# Patient Record
Sex: Female | Born: 1955 | Race: Black or African American | Hispanic: No | Marital: Single | Smoking: Never smoker
Health system: Southern US, Community
[De-identification: ages and names within clinical notes are randomized; demographics above are authoritative.]

## PROBLEM LIST (undated history)

## (undated) DIAGNOSIS — I10 Essential (primary) hypertension: Secondary | ICD-10-CM

## (undated) DIAGNOSIS — E785 Hyperlipidemia, unspecified: Secondary | ICD-10-CM

## (undated) DIAGNOSIS — K219 Gastro-esophageal reflux disease without esophagitis: Secondary | ICD-10-CM

## (undated) HISTORY — DX: Gastro-esophageal reflux disease without esophagitis: K21.9

## (undated) HISTORY — DX: Essential (primary) hypertension: I10

## (undated) HISTORY — DX: Hyperlipidemia, unspecified: E78.5

---

## 2020-04-09 ENCOUNTER — Other Ambulatory Visit: Payer: Self-pay | Admitting: Family Medicine

## 2020-04-09 DIAGNOSIS — R42 Dizziness and giddiness: Secondary | ICD-10-CM

## 2020-04-09 DIAGNOSIS — R2681 Unsteadiness on feet: Secondary | ICD-10-CM

## 2020-04-29 ENCOUNTER — Ambulatory Visit
Admission: RE | Admit: 2020-04-29 | Discharge: 2020-04-29 | Disposition: A | Discharge status: Home / Self Care | Payer: 59 | Source: Ambulatory Visit | Attending: Family Medicine | Admitting: Family Medicine

## 2020-04-29 DIAGNOSIS — R2681 Unsteadiness on feet: Secondary | ICD-10-CM

## 2020-04-29 DIAGNOSIS — R42 Dizziness and giddiness: Secondary | ICD-10-CM

## 2020-05-14 ENCOUNTER — Other Ambulatory Visit: Payer: Self-pay | Admitting: Family Medicine

## 2020-05-14 DIAGNOSIS — Z1231 Encounter for screening mammogram for malignant neoplasm of breast: Secondary | ICD-10-CM

## 2020-08-16 ENCOUNTER — Ambulatory Visit: Payer: 59

## 2020-08-28 ENCOUNTER — Ambulatory Visit: Payer: 59

## 2020-10-04 ENCOUNTER — Ambulatory Visit
Admission: RE | Admit: 2020-10-04 | Discharge: 2020-10-04 | Disposition: A | Discharge status: Home / Self Care | Payer: 59 | Source: Ambulatory Visit | Attending: Family Medicine | Admitting: Family Medicine

## 2020-10-04 ENCOUNTER — Other Ambulatory Visit: Payer: Self-pay

## 2020-10-04 DIAGNOSIS — Z1231 Encounter for screening mammogram for malignant neoplasm of breast: Secondary | ICD-10-CM

## 2020-10-09 DIAGNOSIS — R053 Chronic cough: Secondary | ICD-10-CM | POA: Insufficient documentation

## 2020-10-09 DIAGNOSIS — F320 Major depressive disorder, single episode, mild: Secondary | ICD-10-CM | POA: Insufficient documentation

## 2020-10-09 DIAGNOSIS — I10 Essential (primary) hypertension: Secondary | ICD-10-CM | POA: Insufficient documentation

## 2020-10-09 DIAGNOSIS — F32A Depression, unspecified: Secondary | ICD-10-CM | POA: Insufficient documentation

## 2020-10-09 DIAGNOSIS — F411 Generalized anxiety disorder: Secondary | ICD-10-CM | POA: Insufficient documentation

## 2020-10-10 ENCOUNTER — Telehealth: Payer: Self-pay | Admitting: Pulmonary Disease

## 2020-10-10 ENCOUNTER — Ambulatory Visit (INDEPENDENT_AMBULATORY_CARE_PROVIDER_SITE_OTHER): Payer: 59 | Admitting: Pulmonary Disease

## 2020-10-10 ENCOUNTER — Encounter: Payer: Self-pay | Admitting: Pulmonary Disease

## 2020-10-10 ENCOUNTER — Other Ambulatory Visit: Payer: Self-pay

## 2020-10-10 VITALS — BP 122/84 | HR 95 | Temp 97.8°F | Ht 62.0 in | Wt 122.4 lb

## 2020-10-10 DIAGNOSIS — F411 Generalized anxiety disorder: Secondary | ICD-10-CM

## 2020-10-10 DIAGNOSIS — J45991 Cough variant asthma: Secondary | ICD-10-CM

## 2020-10-10 DIAGNOSIS — I10 Essential (primary) hypertension: Secondary | ICD-10-CM

## 2020-10-10 DIAGNOSIS — R053 Chronic cough: Secondary | ICD-10-CM | POA: Diagnosis not present

## 2020-10-10 DIAGNOSIS — J3089 Other allergic rhinitis: Secondary | ICD-10-CM

## 2020-10-10 DIAGNOSIS — F320 Major depressive disorder, single episode, mild: Secondary | ICD-10-CM

## 2020-10-10 MED ORDER — BREO ELLIPTA 200-25 MCG/INH IN AEPB: 1.0000 | INHALATION_SPRAY | Freq: Every day | RESPIRATORY_TRACT | 2 refills | Status: DC

## 2020-10-10 MED ORDER — MONTELUKAST SODIUM 10 MG PO TABS: 10.0000 mg | ORAL_TABLET | Freq: Every day | ORAL | 2 refills | Status: DC

## 2020-10-10 NOTE — Telephone Encounter (Signed)
Called and spoke with patient regarding questions about her medication. Patient states she was prescribed Singulair and Breo inhaler today. She questions if she should continue the zyrtec and flonase.   Dr. Judeth Horn please advise

## 2020-10-10 NOTE — Progress Notes (Signed)
@Patient  ID: , female    DOB: [birth year], 65 y.o.   MRN: [id number]  Chief Complaint  Patient presents with  . Consult    Sometimes productive cough, clear phlegm. Other times non productive, since 2012.     Referring provider: 2013, MD  HPI:   65 year old whom we are seeing in consultation for chronic cough.  PCP note reviewed.  Patient notes cough for most majority of her life at this point time.  She is had ongoing issues with reflux.  Has had endoscopies many years ago.  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  This was stopped due to ongoing reflux, irritation.  She takes PPI.  She does have breakthrough heartburn.  She reports frequent reflux symptoms despite this.  In addition, she does endorse occasional dysphagia primarily with solids.  Has a drink a few swallows of water and things improve her move on.    Her cough is largely dry, occasionally productive.  Seems worse often when eating.  Worse when she lies down at night.  No other timing during the day where things are better or worse.  She does have seasonal allergies but is unsure if the cough worsens with seasonal changes and these allergy symptoms.  No new environment to account for symptoms.  No other alleviating or exacerbating factors.  She reports had recent chest x-ray via her PCP office.  I cannot view these images.  She was apparently told that reassuring, normal.  PMH: Hypertension, seasonal allergies, GERD Surgical history: Hysterectomy 1995 Family history: Her mother asthma, mother with coronary disease, father with coronary disease, mother with breast cancer, father with testicular cancer Social history: Never smoker, lives in [place] / Pulmonary Flowsheets:   ACT:  No flowsheet data found.  MMRC: No flowsheet data found.  Epworth:  No flowsheet data found.  Tests:   FENO:  No results found for: NITRICOXIDE  PFT: No  flowsheet data found.  WALK:  No flowsheet data found.  Imaging: MM 3D SCREEN BREAST BILATERAL  Result Date: 10/09/2020 CLINICAL DATA:  Screening. EXAM: DIGITAL SCREENING BILATERAL MAMMOGRAM WITH TOMOSYNTHESIS AND CAD TECHNIQUE: Bilateral screening digital craniocaudal and mediolateral oblique mammograms were obtained. Bilateral screening digital breast tomosynthesis was performed. The images were evaluated with computer-aided detection. COMPARISON:  Previous exam(s). ACR Breast Density Category c: The breast tissue is heterogeneously dense, which may obscure small masses. FINDINGS: There are no findings suspicious for malignancy. The images were evaluated with computer-aided detection. IMPRESSION: No mammographic evidence of malignancy. A result letter of this screening mammogram will be mailed directly to the patient. RECOMMENDATION: Screening mammogram in one year. (Code:SM-B-01Y) BI-RADS CATEGORY  1: Negative. Electronically Signed   By: 10/11/2020 M.D.   On: 10/09/2020 15:31    Lab Results:  CBC No results found for: WBC, RBC, HGB, HCT, PLT, MCV, MCH, MCHC, RDW, LYMPHSABS, MONOABS, EOSABS, BASOSABS  BMET No results found for: NA, K, CL, CO2, GLUCOSE, BUN, CREATININE, CALCIUM, GFRNONAA, GFRAA  BNP No results found for: BNP  ProBNP No results found for: PROBNP  Specialty Problems      Pulmonary Problems   Chronic cough      Allergies  Allergen Reactions  . Timoptic [Timolol] Cough    Asthma like symptoms     There is no immunization history on file for this patient.  Past Medical History:  Diagnosis Date  . Gastric reflux   . Hyperlipidemia   .  Hypertension     Tobacco History: Social History   Tobacco Use  Smoking Status Never Smoker  Smokeless Tobacco Never Used   Counseling given: Not Answered   Continue to not smoke  Outpatient Encounter Medications as of 10/10/2020  Medication Sig  . amLODipine (NORVASC) 10 MG tablet Take 1 tablet by mouth daily.   . cetirizine HCl (ZYRTEC) 1 MG/ML solution Take 1 mg by mouth daily.  . citalopram (CELEXA) 20 MG tablet Take 1 tablet by mouth daily.  . fluticasone (FLONASE) 50 MCG/ACT nasal spray Place 1 spray into both nostrils daily.  . fluticasone furoate-vilanterol (BREO ELLIPTA) 200-25 MCG/INH AEPB Inhale 1 puff into the lungs daily.  Marland Kitchen latanoprost (XALATAN) 0.005 % ophthalmic solution Place 1 drop into both eyes at bedtime.  . montelukast (SINGULAIR) 10 MG tablet Take 1 tablet (10 mg total) by mouth at bedtime.  Marland Kitchen omeprazole (PRILOSEC) 40 MG capsule Take 1 capsule by mouth daily.  . [DISCONTINUED] amLODipine (NORVASC) 10 MG tablet Take 1 tablet by mouth daily.   No facility-administered encounter medications on file as of 10/10/2020.     Review of Systems  Review of Systems  No chest pain with exertion.  No orthopnea or PND.  Comprehensive review of systems otherwise negative.  Physical Exam  BP 122/84 (BP Location: Left Arm, Cuff Size: Normal)   Pulse 95   Temp 97.8 F (36.6 C) (Temporal)   Ht 5\' 2"  (1.575 m)   Wt 122 lb 6.4 oz (55.5 kg)   SpO2 97%   BMI 22.39 kg/m   Wt Readings from Last 5 Encounters:  10/10/20 122 lb 6.4 oz (55.5 kg)    BMI Readings from Last 5 Encounters:  10/10/20 22.39 kg/m     Physical Exam General: Well-appearing, no acute distress Eyes: EOMI, no icterus Neck: Supple: No JVP Pulmonary: Clear to auscultation bilaterally, no wheeze, frequent barky cough suspicious for laryngeal edema, no stridor Cardiovascular: Regular rate and rhythm, no murmurs Abdomen: Nondistended, bowel sounds present MSK: No synovitis, no joint effusions Neuro: Normal gait, no weakness Psych: Normal mood, full affect  Assessment & Plan:   Chronic Cough: Present for majority of her life at this point.  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  Has had ongoing cough since then, 10 years prior. Most likely component of GERD +/- asthma.    Dysphagia: With reflux despite PPI. --Barium swallow, likely will need GI referral regardless of result  Asthma: atopic symptoms cough. Worsened by uncontrolled reflux. W/u as above. --Breo prescribed today  Return in about 6 weeks (around 11/21/2020).   01/21/2021, MD 10/10/2020

## 2020-10-10 NOTE — Telephone Encounter (Signed)
Call returned to patient, confirmed DOB. Made aware to continue flonase and zyrtec. Voiced understanding.   Nothing further needed at this time.

## 2020-10-10 NOTE — Patient Instructions (Addendum)
Nice to meet you  I think there are many reasons you are coughing  I have ordered a barium swallow to evaluate for reflux or things getting stuck when you swallow.  Someone will call and schedule this.  For the nasal congestion, take montelukast 1 pill nightly.  This may help with the cough if congestion is causing it.  With the congestion and allergies, I worry about something like asthma.  The montelukast may help with this.  In addition use Breo 1 puff daily.  Rinse your mouth out with water after every use.  If this is too expensive, please call us and we will try to find a better solution.  Return to clinic in 6 weeks for follow-up with Dr. Judeth Horn.

## 2020-10-10 NOTE — Telephone Encounter (Signed)
LMTCB

## 2020-10-10 NOTE — Telephone Encounter (Signed)
Yes, please continue Flonase and Zyrtec. I had indicated she should continue during visit today but suspect there was miscommunication.

## 2020-10-10 NOTE — Telephone Encounter (Signed)
Pt returning a phone call pt can be reached at [phone]. Pt will not be available until 3 pm.

## 2020-10-16 ENCOUNTER — Telehealth: Payer: Self-pay | Admitting: Pulmonary Disease

## 2020-10-16 NOTE — Telephone Encounter (Signed)
Called [place], [place] Hester Mates requested LOV note with Dr. Judeth Horn to be faxed. Requested OV note faxed to [facility]. Confirmation received. Nothing further at this time.

## 2020-10-18 ENCOUNTER — Other Ambulatory Visit: Payer: Self-pay

## 2020-10-18 ENCOUNTER — Ambulatory Visit (HOSPITAL_COMMUNITY)
Admission: RE | Admit: 2020-10-18 | Discharge: 2020-10-18 | Disposition: A | Discharge status: Home / Self Care | Payer: 59 | Source: Ambulatory Visit | Attending: Pulmonary Disease | Admitting: Pulmonary Disease

## 2020-10-18 DIAGNOSIS — R053 Chronic cough: Secondary | ICD-10-CM | POA: Insufficient documentation

## 2020-10-24 ENCOUNTER — Telehealth: Payer: Self-pay | Admitting: Pulmonary Disease

## 2020-10-24 DIAGNOSIS — R053 Chronic cough: Secondary | ICD-10-CM

## 2020-10-24 NOTE — Telephone Encounter (Signed)
Called and spoke with pt. Pt is requesting to know the results of the DG Esophagus that she had done 3/31.  Dr. Judeth Horn, please advise.

## 2020-10-25 ENCOUNTER — Telehealth: Payer: Self-pay | Admitting: Pulmonary Disease

## 2020-10-25 ENCOUNTER — Other Ambulatory Visit (HOSPITAL_COMMUNITY): Payer: Self-pay

## 2020-10-25 DIAGNOSIS — R1310 Dysphagia, unspecified: Secondary | ICD-10-CM

## 2020-10-25 NOTE — Telephone Encounter (Signed)
During the test the radiologist indicated there was some coughing with swallowing but did not see frank aspiration.  Because of this, I have ordered a referral to our speech pathologist to do additional testing and evaluate if this is causing some symptoms.  In addition, it showed that the esophagus is not quite squeezing is normal.  We may need to do additional investigation with our GI colleagues but I would not start with the swallow evaluation with our speech and language colleagues.

## 2020-10-25 NOTE — Telephone Encounter (Signed)
Prior Versions: 1. Hunsucker, Lesia Sago, MD (Physician) at 10/10/2020 11:01 AM - Signed     Nice to meet you  I think there are many reasons you are coughing  I have ordered a barium swallow to evaluate for reflux or things getting stuck when you swallow.  Someone will call and schedule this.  For the nasal congestion, take montelukast 1 pill nightly.  This may help with the cough if congestion is causing it.  With the congestion and allergies, I worry about something like asthma.  The montelukast may help with this.  In addition use Breo 1 puff daily.  Rinse your mouth out with water after every use.  If this is too expensive, please call us and we will try to find a better solution.  Return to clinic in 6 weeks for follow-up with Dr. Judeth Horn.     Called and spoke with pt and she stated that she does not have a follow up appt with MH until May. She has a follow up with her PCP in the morning and she would like to be able to let her PCP know what is going on.  She is scheduled with speech pathology and she had the barium swallow done and they rec for her to have another type of barium swallow.  She stated that she is confused as to what all is going on and she would like MH to explain this to her.  She stated that she is not sure if all the meds and inhaler is still necessary at this point and feels that MH would be able to answer any questions that she has instead of having so many people call her and then having to send the message back to the provider.  MH are you able to call the pt to speak with her?  thanks

## 2020-10-25 NOTE — Telephone Encounter (Signed)
Called and spoke with patient to let her know of recs from Dr. Judeth Horn. Patient expressed understanding, referral order has been placed and Dr. Judeth Horn has ordered Barium swallow. Nothing further needed at this time.

## 2020-10-29 ENCOUNTER — Ambulatory Visit (HOSPITAL_COMMUNITY)
Admission: RE | Admit: 2020-10-29 | Discharge: 2020-10-29 | Disposition: A | Discharge status: Home / Self Care | Payer: 59 | Source: Ambulatory Visit | Attending: Pulmonary Disease | Admitting: Pulmonary Disease

## 2020-10-29 ENCOUNTER — Other Ambulatory Visit: Payer: Self-pay

## 2020-10-29 DIAGNOSIS — R053 Chronic cough: Secondary | ICD-10-CM | POA: Insufficient documentation

## 2020-10-29 DIAGNOSIS — R1310 Dysphagia, unspecified: Secondary | ICD-10-CM | POA: Insufficient documentation

## 2020-10-29 NOTE — Progress Notes (Signed)
Modified Barium Swallow Progress Note  Patient Details  Name: Kayla Black MRN: [id number] Date of Birth: 09-25-1955  Today's Date: 10/29/2020  Modified Barium Swallow completed.  Full report located under Chart Review in the Imaging Section.  Brief recommendations include the following:  Clinical Impression  Pt's oropharyngeal swallow is WFL. She often uses piecemeal swallows but exhibits good bolus control. She did have a strong coughing episode during trials of solids, but this was not related to aspiration, as she had no penetration or aspiration across all trials. Upon brief scan during this episode, pt did appear to have barium sitting in her esophagus (MD not present to confirm). Recommend continuing a regular diet and thin liquids as tolerated. A handout was provided regarding esophageal precautions and pt was encouraged to continue ongoing f/u with her other providers.   Swallow Evaluation Recommendations   Recommended Consults: Consider GI evaluation;Consider ENT evaluation   SLP Diet Recommendations: Regular solids;Thin liquid   Liquid Administration via: Cup;Straw   Medication Administration: Whole meds with liquid   Supervision: Patient able to self feed   Compensations: Slow rate;Small sips/bites;Follow solids with liquid   Postural Changes: Seated upright at 90 degrees;Remain semi-upright after after feeds/meals (Comment)   Oral Care Recommendations: Oral care BID        Mahala Menghini., M.A. CCC-SLP Acute Rehabilitation Services Pager [phone] Office [phone]  10/29/2020,12:03 PM

## 2020-11-15 ENCOUNTER — Other Ambulatory Visit: Payer: Self-pay

## 2020-11-15 ENCOUNTER — Ambulatory Visit: Payer: 59 | Attending: Pulmonary Disease | Admitting: Speech Pathology

## 2020-11-15 DIAGNOSIS — R498 Other voice and resonance disorders: Secondary | ICD-10-CM | POA: Diagnosis present

## 2020-11-16 NOTE — Therapy (Signed)
[facility] [address] [place], [place], [address] Phone: [phone]   Fax:  [phone]  Speech Language Pathology Evaluation  Patient Details  Name: Kayla Black MRN: [id number] Date of Birth: 21-Dec-1955 Referring Provider (SLP): Hunsucker, Matther R MD   Encounter Date: 11/15/2020   End of Session - 11/15/20 1602    Visit Number 1    Number of Visits 5    Date for SLP Re-Evaluation 01/16/19    SLP Start Time 1525    SLP Stop Time  1610    SLP Time Calculation (min) 45 min    Activity Tolerance Patient tolerated treatment well           Past Medical History:  Diagnosis Date  . Gastric reflux   . Hyperlipidemia   . Hypertension     No past surgical history on file.  There were no vitals filed for this visit.   Subjective Assessment - 11/15/20 1546    Subjective [name].    Currently in Pain? No/denies              SLP Evaluation [facility] - 11/15/20 1553      SLP Visit Information   SLP Received On 11/15/20    Referring Provider (SLP) Hunsucker, Matther R MD    Onset Date "Several years"    Medical Diagnosis Chronic cough      Subjective   [name]      General Information   HPI Pt is a 65 yo female presenting for OP evaluation of voice and chronic cough. Pt has long hx of chronic cough and hx of "vocal fold ulcers". Has seen Pulmonary, ENT, and SLP. Irritation visually noted in larynx suspect due to reflux. MBS was [facility] w/ mild residue noted in the esophagus.      Balance Screen   Has the patient fallen in the past 6 months No    Has the patient had a decrease in activity level because of a fear of falling?  No    Is the patient reluctant to leave their home because of a fear of falling?  No      Prior Functional Status   Cognitive/Linguistic Baseline Within functional limits    Type of Home  House     Lives With Alone;Other (Comment)   Brother lives in, but don't have much interaction   Available Support Family;Friend(s)    Vocation Full time employment      Cognition   Overall Cognitive Status Within Functional Limits for tasks assessed      Auditory Comprehension   Overall Auditory Comprehension Appears within functional limits for tasks assessed      Reading Comprehension   Reading Status Within funtional limits      Expression   Primary Mode of Expression Verbal      Verbal Expression   Overall Verbal Expression Appears within functional limits for tasks assessed      Motor Speech   Phonation Other (comment)   hoarse, strangled vocal quality   Phonation Impaired    Vocal Abuses Habitual Cough/Throat Clear;Vocal Fold Dehydration    Tension Present Neck    Volume Soft    Pitch Low      Standardized Assessments   Standardized Assessments  Other Assessment    Other Assessment Cough Severity Index, Leicester Cough Questionnaire, Vocal case history  Assessment   SLP Visit Diagnosis Other (comment)   other voice and resonance     SLP Recommendations   Follow up Recommendations Outpatient SLP                           SLP Education - 11/15/20 1601    Education Details chronic cough and impact on vocal folds    Person(s) Educated Patient    Methods Explanation    Comprehension Verbalized understanding            SLP Short Term Goals - 11/16/20 1150      SLP SHORT TERM GOAL #1   Title Pt will demonstrate abdominal breathing during sentences to assist with reduction of tension.    Time 4    Period Weeks    Status New      SLP SHORT TERM GOAL #2   Title Pt will demonstrate use of relaxation techniques in therapy to assist with reduction of tension.    Time 4    Period Weeks    Status New      SLP SHORT TERM GOAL #3   Title Pt report use of vocal hygiene strategies to assist in reduction of laryngeal irritation.    Time 4     Period Weeks    Status New            SLP Long Term Goals - 11/16/20 1140      SLP LONG TERM GOAL #1   Title Pt will reduce Cough Severity Index (CSI) and/or Leicester Cough Questionnaire by 25% to indicate increase in QOL.    Baseline Scores: (CSI - 24); (LCQ - 45.15)    Time 8    Period Weeks    Status New      SLP LONG TERM GOAL #2   Title Pt will premeditate use of relaxation exercises to prevent cough.    Time 8    Period Weeks    Status New      SLP LONG TERM GOAL #3   Title Pt will increase use of vocal hygiene strategies at home to reduce laryngeal irritation.    Time 8    Period Weeks    Status New            Plan - 11/15/20 1607    Clinical Impression Statement Pt is a 65 yo female who was referred by pulmonary for evaluation due to pt hx of chronic cough. Pt completed thorough voice case history, and two chronic cough questionnaires. [name]. Pt's voice was hoarse and strained at eval. Pt reports: chronic cough has been going on for years, two months ago - it worsened to where she was gagging. Previously seen by SLP for MBS [facility]) and ENT. Past hx of vocal fold lesions. She reports she self-monitors and modifies her lifestyle so she doesn't have to speak as much at work.  Pt feels re: tightness, tension, tiredness, strain, tickle prior to cough, throat dryness. She reports that "talking to fast" triggers her coughing episodes and that she feels like she is holding her breath. SLP rec skilled speech services to address relaxation and breathing exercises, as well as vocal hygiene to increase her QOL.    Speech Therapy Frequency Biweekly    Duration 8 weeks    Treatment/Interventions Compensatory strategies    Potential to Achieve Goals Good  Patient will benefit from skilled therapeutic intervention in order to improve the following deficits and impairments:   Other voice and resonance disorders    Problem  List Patient Active Problem List   Diagnosis Date Noted  . Benign essential HTN 10/09/2020  . 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 Generalized anxiety disorder 10/09/2020  . Chronic cough 10/09/2020    [name] Bodo MS, [place], CBIS  11/16/2020, 12:07 PM  [facility] [address] [place], [place], [address] Phone: [phone]   Fax:  [phone]  Name: Kayla Black MRN: [id number] Date of Birth: 1955/12/24

## 2020-11-29 ENCOUNTER — Encounter: Payer: Self-pay | Admitting: Speech Pathology

## 2020-11-29 ENCOUNTER — Ambulatory Visit: Payer: 59 | Attending: Pulmonary Disease | Admitting: Speech Pathology

## 2020-11-29 ENCOUNTER — Other Ambulatory Visit: Payer: Self-pay

## 2020-11-29 DIAGNOSIS — R053 Chronic cough: Secondary | ICD-10-CM | POA: Insufficient documentation

## 2020-11-29 DIAGNOSIS — R498 Other voice and resonance disorders: Secondary | ICD-10-CM | POA: Diagnosis not present

## 2020-11-29 NOTE — Therapy (Signed)
[facility] [address] [place], [place], [address] Phone: [phone]   Fax:  [phone]  Speech Language Pathology Treatment  Patient Details  Name: Kayla Black MRN: [id number] Date of Birth: 1956/01/31 Referring Provider (SLP): Hunsucker, Matther R MD   Encounter Date: 11/29/2020   End of Session - 11/29/20 1701    Visit Number 2    Number of Visits 5    Date for SLP Re-Evaluation 01/16/19    SLP Start Time 1615    SLP Stop Time  1700    SLP Time Calculation (min) 45 min    Activity Tolerance Patient tolerated treatment well           Past Medical History:  Diagnosis Date  . Gastric reflux   . Hyperlipidemia   . Hypertension     History reviewed. No pertinent surgical history.  There were no vitals filed for this visit.          ADULT SLP TREATMENT - 11/29/20 1704      General Information   Behavior/Cognition Alert;Cooperative;Pleasant mood      Cognitive-Linquistic Treatment   Treatment focused on Voice    Skilled Treatment Introduced techniques for reducing cough/throat clears and tension (hard swallows, diaphagmatic breathing, vocal resonance etc). Also introduced vocal hygiene treatment plan. Pt reports she looks forward to trialing these treatments at home.      Assessment / Recommendations / Plan   Plan Continue with current plan of care      Progression Toward Goals   Progression toward goals Progressing toward goals              SLP Short Term Goals - 11/29/20 1702      SLP SHORT TERM GOAL #1   Title Pt will demonstrate abdominal breathing during sentences to assist with reduction of tension.    Time 3    Period Weeks    Status On-going      SLP SHORT TERM GOAL #2   Title Pt will demonstrate use of relaxation techniques in therapy to assist with reduction of tension.    Time 3    Period Weeks    Status On-going      SLP SHORT TERM GOAL #3   Title Pt report use of  vocal hygiene strategies to assist in reduction of laryngeal irritation.    Time 3    Period Weeks    Status On-going            SLP Long Term Goals - 11/29/20 1703      SLP LONG TERM GOAL #1   Title Pt will reduce Cough Severity Index (CSI) and/or Leicester Cough Questionnaire by 25% to indicate increase in QOL.    Baseline Scores: (CSI - 24); (LCQ - 45.15)    Time 7    Period Weeks    Status On-going      SLP LONG TERM GOAL #2   Title Pt will premeditate use of relaxation exercises to prevent cough.    Time 7    Period Weeks    Status On-going      SLP LONG TERM GOAL #3   Title Pt will increase use of vocal hygiene strategies at home to reduce laryngeal irritation.    Time 7    Period Weeks    Status On-going            Plan - 11/29/20 1701    Clinical Impression Statement Pt reported that she  felt good about today's session. She was able to demonstrate all exercises independently. To see in 2 weeks to give patient time to trial techniques and implement vocal hygiene.  SLP rec skilled speech services to address relaxation and breathing exercises, as well as vocal hygiene to increase her QOL.    Speech Therapy Frequency Biweekly    Duration 8 weeks    Treatment/Interventions Compensatory strategies    Potential to Achieve Goals Good           Patient will benefit from skilled therapeutic intervention in order to improve the following deficits and impairments:   Other voice and resonance disorders  Chronic cough    Problem List Patient Active Problem List   Diagnosis Date Noted  . Benign essential HTN 10/09/2020  . 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 Generalized anxiety disorder 10/09/2020  . Chronic cough 10/09/2020    [name] Bodo MS, [place], CBIS  11/29/2020, 5:07 PM  [facility] [address]. [place], [place], [address] Phone: [phone]   Fax:  [phone]   Name: Kayla Black MRN: [id number] Date of Birth: 1956-02-25

## 2020-12-13 ENCOUNTER — Encounter: Payer: Self-pay | Admitting: Speech Pathology

## 2020-12-13 ENCOUNTER — Ambulatory Visit: Payer: 59 | Admitting: Speech Pathology

## 2021-01-08 ENCOUNTER — Other Ambulatory Visit: Payer: Self-pay | Admitting: Pulmonary Disease

## 2021-01-19 ENCOUNTER — Other Ambulatory Visit: Payer: Self-pay | Admitting: Pulmonary Disease

## 2021-08-30 ENCOUNTER — Other Ambulatory Visit: Payer: Self-pay | Admitting: Family Medicine

## 2021-08-30 DIAGNOSIS — Z1231 Encounter for screening mammogram for malignant neoplasm of breast: Secondary | ICD-10-CM

## 2021-10-08 ENCOUNTER — Ambulatory Visit
Admission: RE | Admit: 2021-10-08 | Discharge: 2021-10-08 | Disposition: A | Discharge status: Home / Self Care | Payer: 59 | Source: Ambulatory Visit | Attending: Family Medicine | Admitting: Family Medicine

## 2021-10-08 DIAGNOSIS — Z1231 Encounter for screening mammogram for malignant neoplasm of breast: Secondary | ICD-10-CM

## 2022-01-25 LAB — GLUCOSE, POCT (MANUAL RESULT ENTRY): POC Glucose: 101 mg/dl — AB (ref 70–99)

## 2022-06-17 ENCOUNTER — Encounter: Payer: Self-pay | Admitting: *Deleted

## 2022-06-17 NOTE — Progress Notes (Signed)
Chart review indicates that pt continues to see Dr. Verlon Au at [facility], as her PCP. Her last appt was 01/23/22, when her b/p was 118/70, and she has a future appt on 07/02/22. No further health equity team support indicated at this time.

## 2022-09-05 ENCOUNTER — Other Ambulatory Visit: Payer: Self-pay | Admitting: Family Medicine

## 2022-09-05 DIAGNOSIS — Z1231 Encounter for screening mammogram for malignant neoplasm of breast: Secondary | ICD-10-CM

## 2022-10-16 ENCOUNTER — Ambulatory Visit
Admission: RE | Admit: 2022-10-16 | Discharge: 2022-10-16 | Disposition: A | Discharge status: Home / Self Care | Payer: Medicare Other | Source: Ambulatory Visit | Attending: Family Medicine | Admitting: Family Medicine

## 2022-10-16 DIAGNOSIS — Z1231 Encounter for screening mammogram for malignant neoplasm of breast: Secondary | ICD-10-CM

## 2022-10-21 ENCOUNTER — Other Ambulatory Visit: Payer: Self-pay | Admitting: Family Medicine

## 2022-10-21 DIAGNOSIS — R928 Other abnormal and inconclusive findings on diagnostic imaging of breast: Secondary | ICD-10-CM

## 2022-10-30 ENCOUNTER — Ambulatory Visit
Admission: RE | Admit: 2022-10-30 | Discharge: 2022-10-30 | Disposition: A | Discharge status: Home / Self Care | Payer: Medicare Other | Source: Ambulatory Visit | Attending: Family Medicine | Admitting: Family Medicine

## 2022-10-30 DIAGNOSIS — R928 Other abnormal and inconclusive findings on diagnostic imaging of breast: Secondary | ICD-10-CM

## 2023-01-16 ENCOUNTER — Other Ambulatory Visit: Payer: Self-pay | Admitting: Family Medicine

## 2023-01-16 ENCOUNTER — Ambulatory Visit
Admission: RE | Admit: 2023-01-16 | Discharge: 2023-01-16 | Disposition: A | Discharge status: Home / Self Care | Payer: Medicare Other | Source: Ambulatory Visit | Attending: Family Medicine | Admitting: Family Medicine

## 2023-01-16 DIAGNOSIS — M545 Low back pain, unspecified: Secondary | ICD-10-CM

## 2023-09-16 ENCOUNTER — Other Ambulatory Visit: Payer: Self-pay | Admitting: Family Medicine

## 2023-09-16 DIAGNOSIS — Z1231 Encounter for screening mammogram for malignant neoplasm of breast: Secondary | ICD-10-CM

## 2023-11-02 ENCOUNTER — Ambulatory Visit
Admission: RE | Admit: 2023-11-02 | Discharge: 2023-11-02 | Disposition: A | Discharge status: Home / Self Care | Payer: Medicare Other | Source: Ambulatory Visit | Attending: Family Medicine | Admitting: Family Medicine

## 2023-11-02 DIAGNOSIS — Z1231 Encounter for screening mammogram for malignant neoplasm of breast: Secondary | ICD-10-CM

## 2024-01-04 IMAGING — MG MM DIGITAL SCREENING BILAT W/ TOMO AND CAD
6 of 10 series · 6 of 30 positions shown · non-contrast
Comparison: Previous exam(s).

CLINICAL DATA: Screening.

EXAM:
DIGITAL SCREENING BILATERAL MAMMOGRAM WITH TOMOSYNTHESIS AND CAD
TECHNIQUE: Bilateral screening digital craniocaudal and mediolateral oblique
mammograms were obtained. Bilateral screening digital breast
tomosynthesis was performed. The images were evaluated with
computer-aided detection.

[L CC synth-2D]
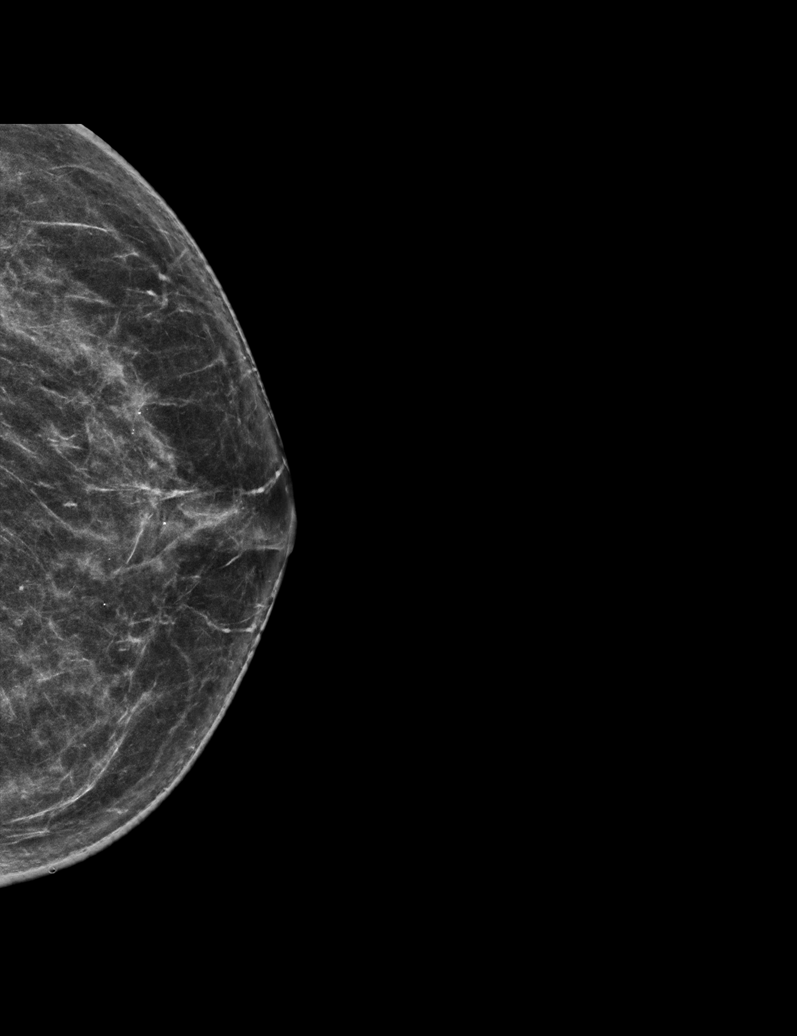

[R MLO synth-2D (1 of 2)]
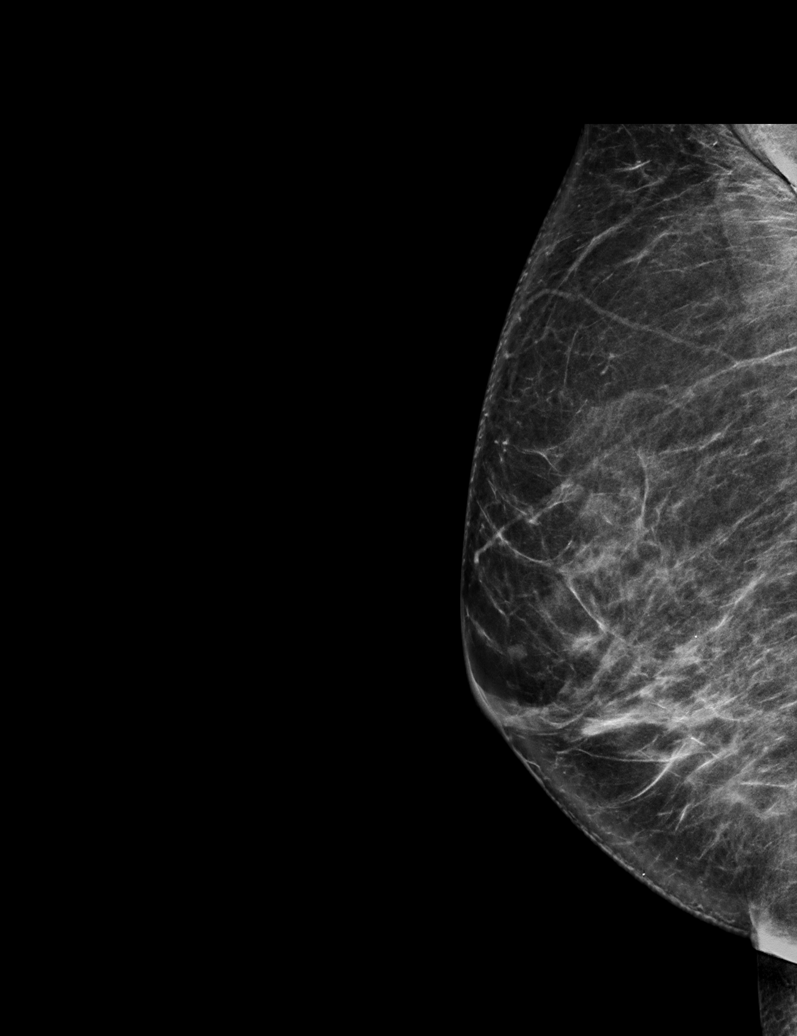

[R CC synth-2D]
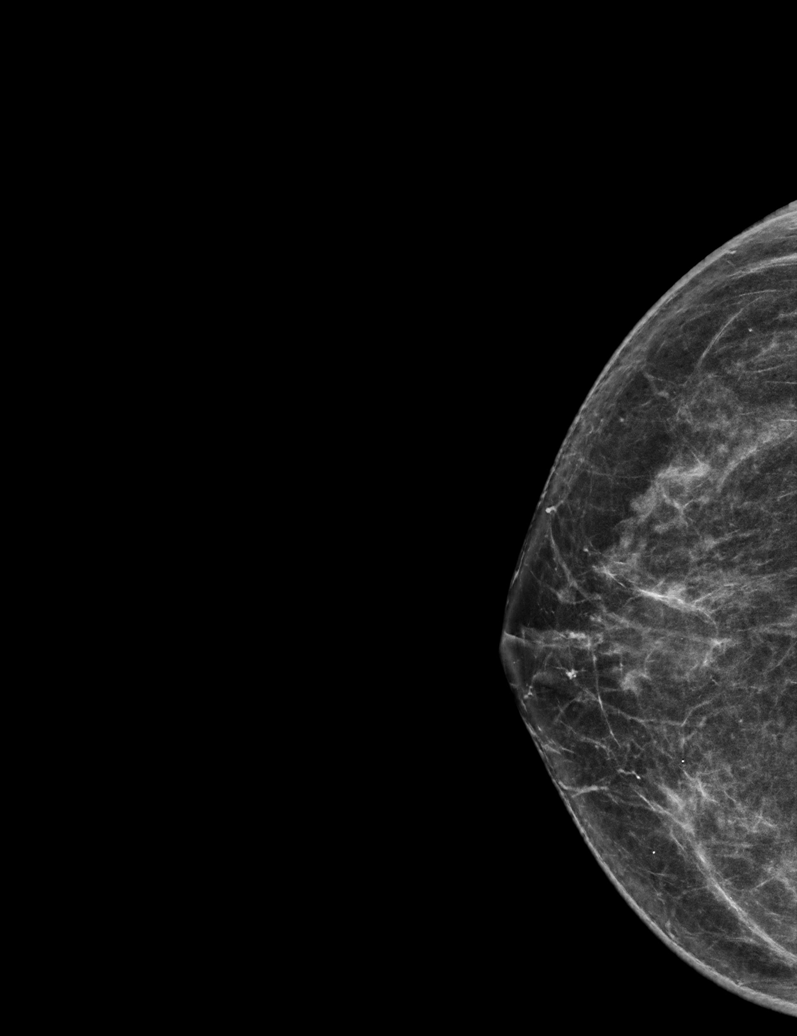

[L MLO synth-2D]
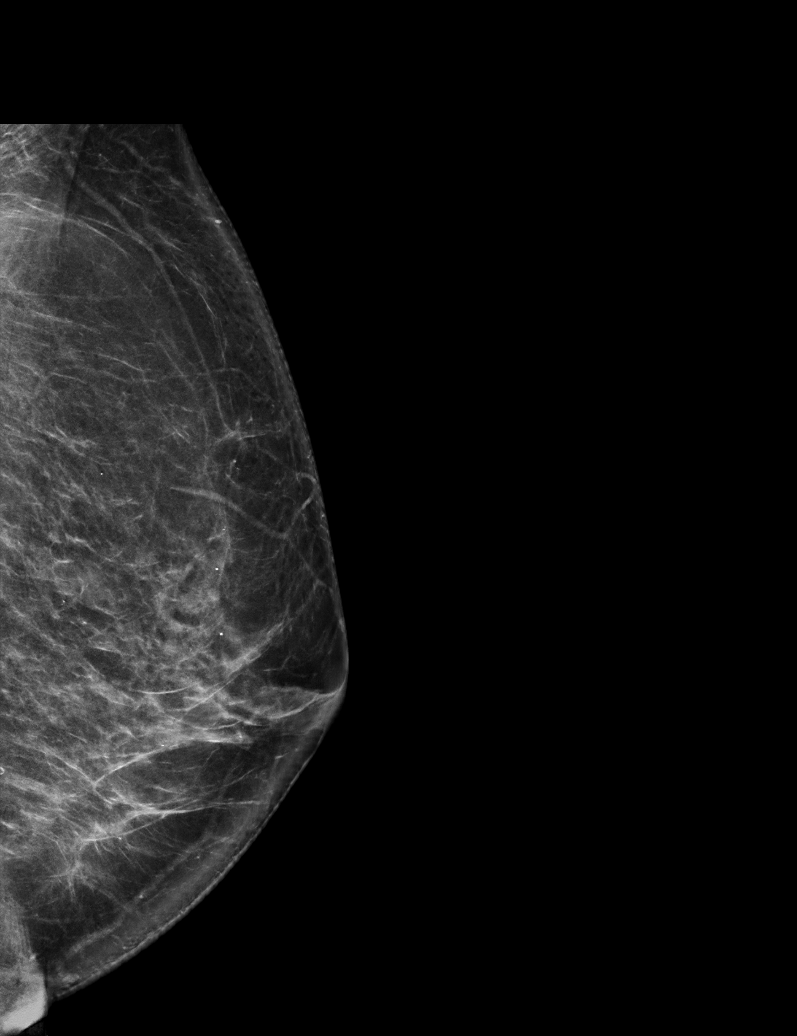

[R MLO synth-2D (2 of 2)]
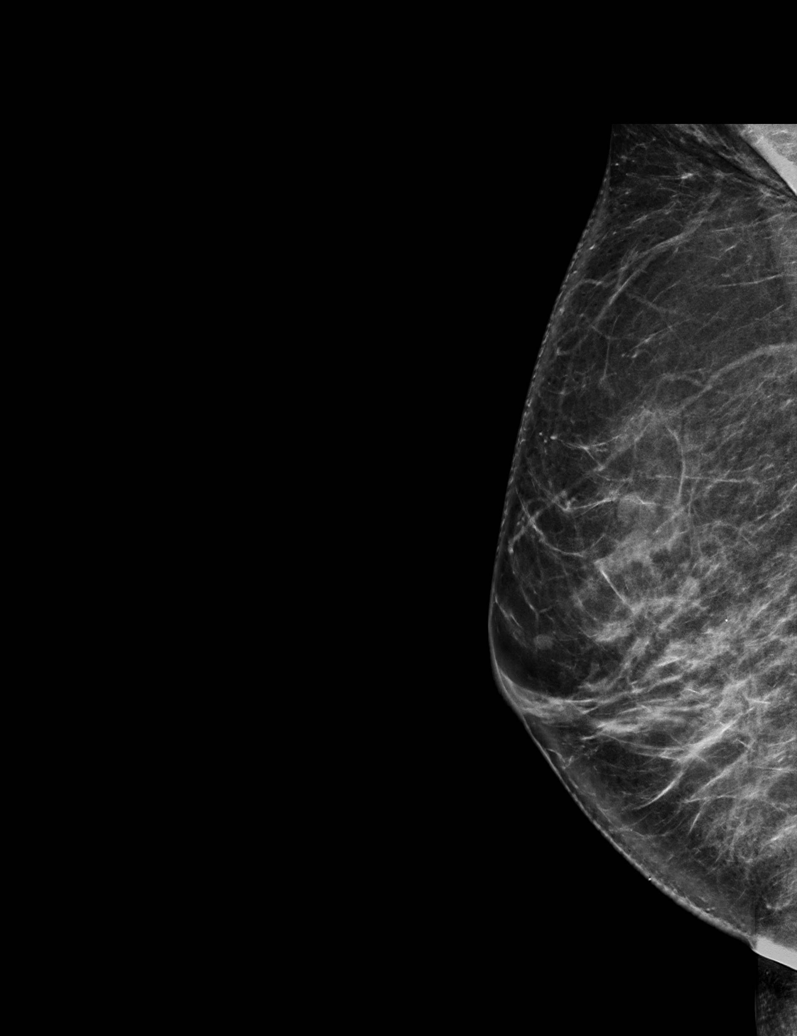

[R MLO tomo · tomo slice 36/71.0]
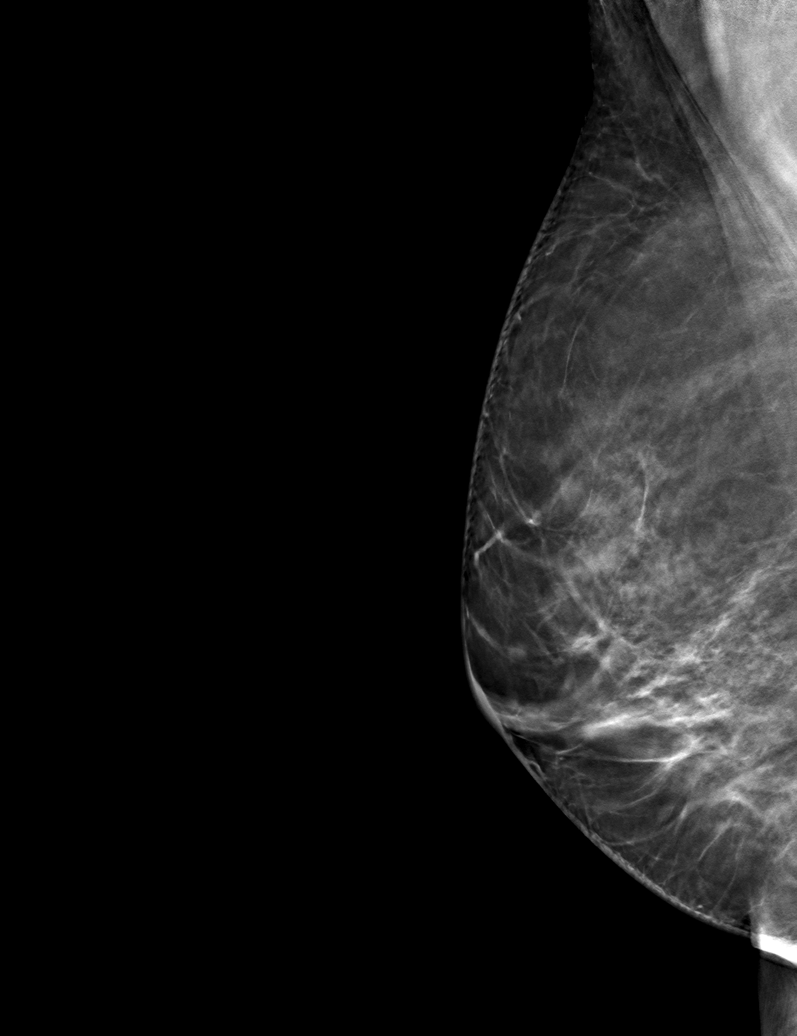

[6 of 30 positions shown; findings below may reference images not displayed]

ACR Breast Density Category c: The breast tissue is heterogeneously
dense, which may obscure small masses.
FINDINGS: There are no findings suspicious for malignancy.
IMPRESSION: No mammographic evidence of malignancy. A result letter of this
screening mammogram will be mailed directly to the patient.

RECOMMENDATION:
Screening mammogram in one year. (Code:Q3-W-BC3)

BI-RADS CATEGORY  1: Negative.
